# Patient Record
Sex: Female | Born: 1990 | Race: White | Hispanic: No | Marital: Single | Smoking: Former smoker
Health system: Southern US, Community
[De-identification: ages and names within clinical notes are randomized; demographics above are authoritative.]

## PROBLEM LIST (undated history)

## (undated) DIAGNOSIS — D649 Anemia, unspecified: Secondary | ICD-10-CM

## (undated) DIAGNOSIS — Z8614 Personal history of Methicillin resistant Staphylococcus aureus infection: Secondary | ICD-10-CM

## (undated) HISTORY — PX: NO PAST SURGERIES: SHX2092

---
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
## 2013-12-01 IMAGING — CR DG HAND COMPLETE 3+V*L*
3 series · 3 of 3 positions shown · non-contrast
Comparison: Left hand x-rays 10/03/2003.

CLINICAL DATA: Injury to left hand yesterday, struck on a metal
sink.  Pain and soft tissue swelling involving the long finger.

LEFT HAND - COMPLETE 3+ VIEW

[view not recorded (1 of 3)]
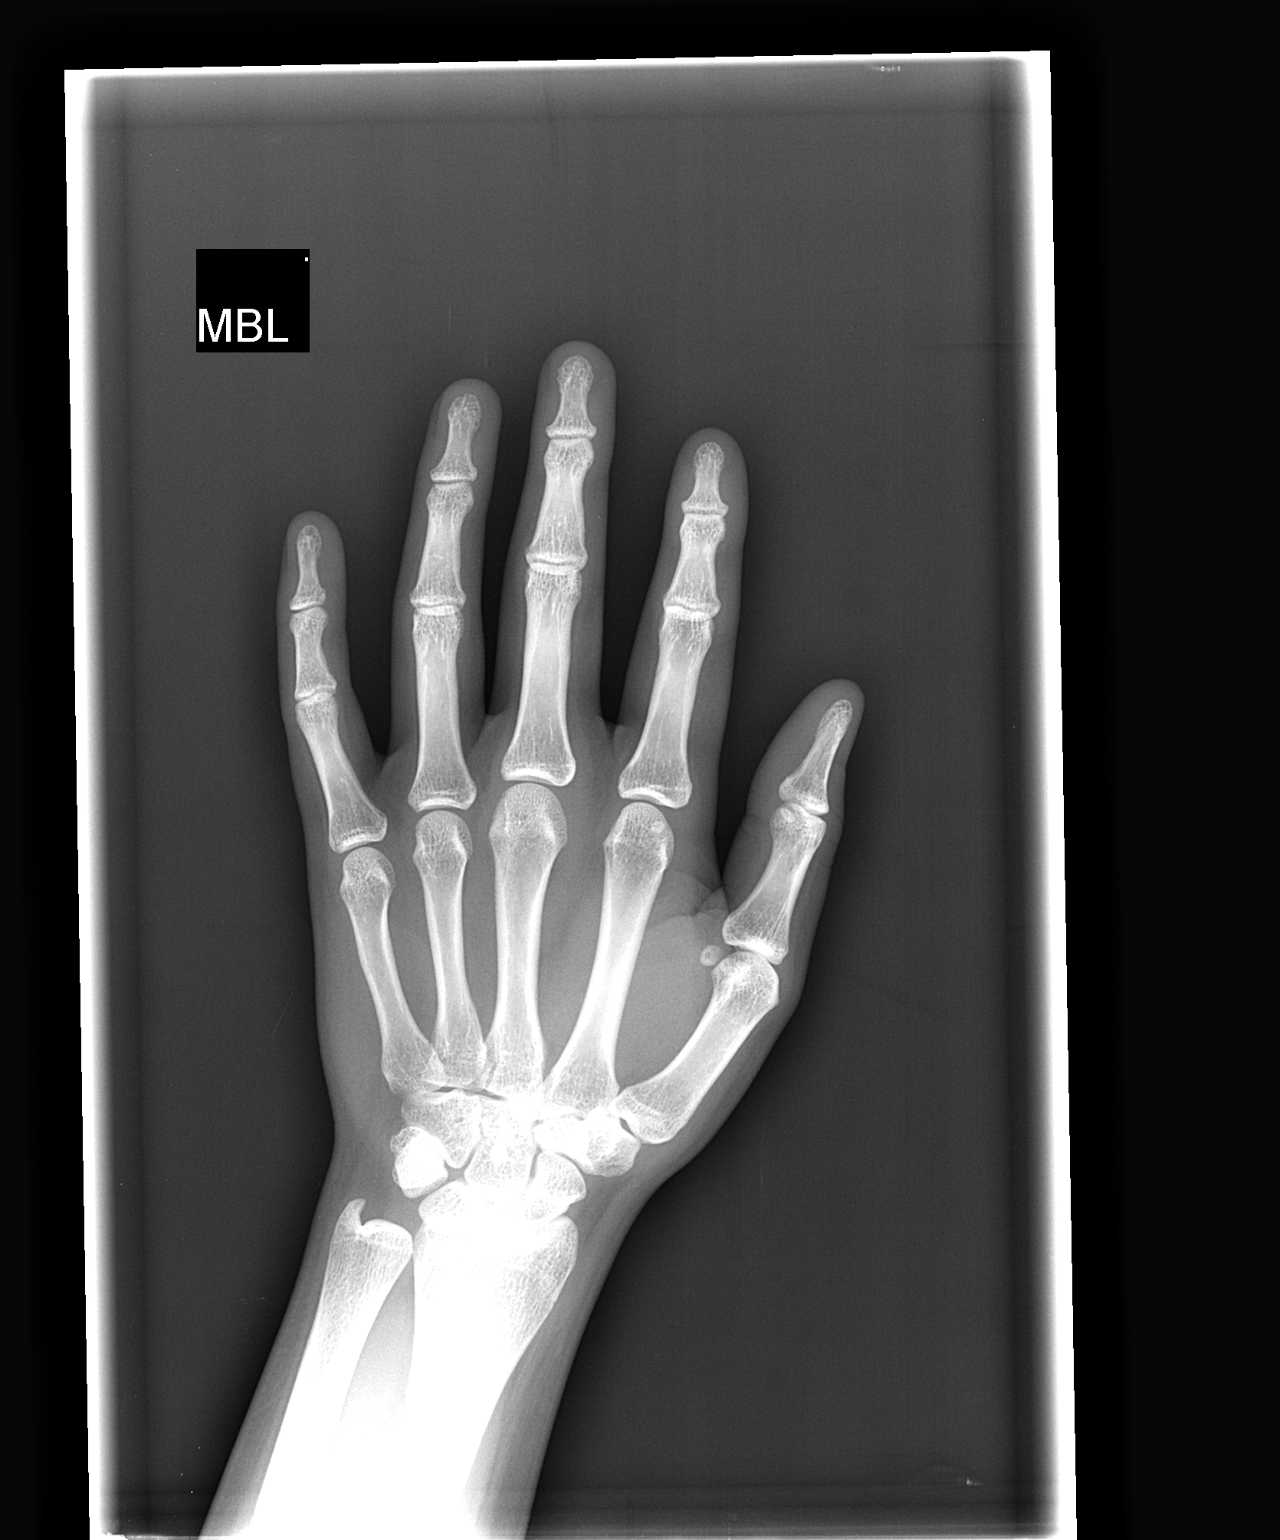

[view not recorded (2 of 3)]
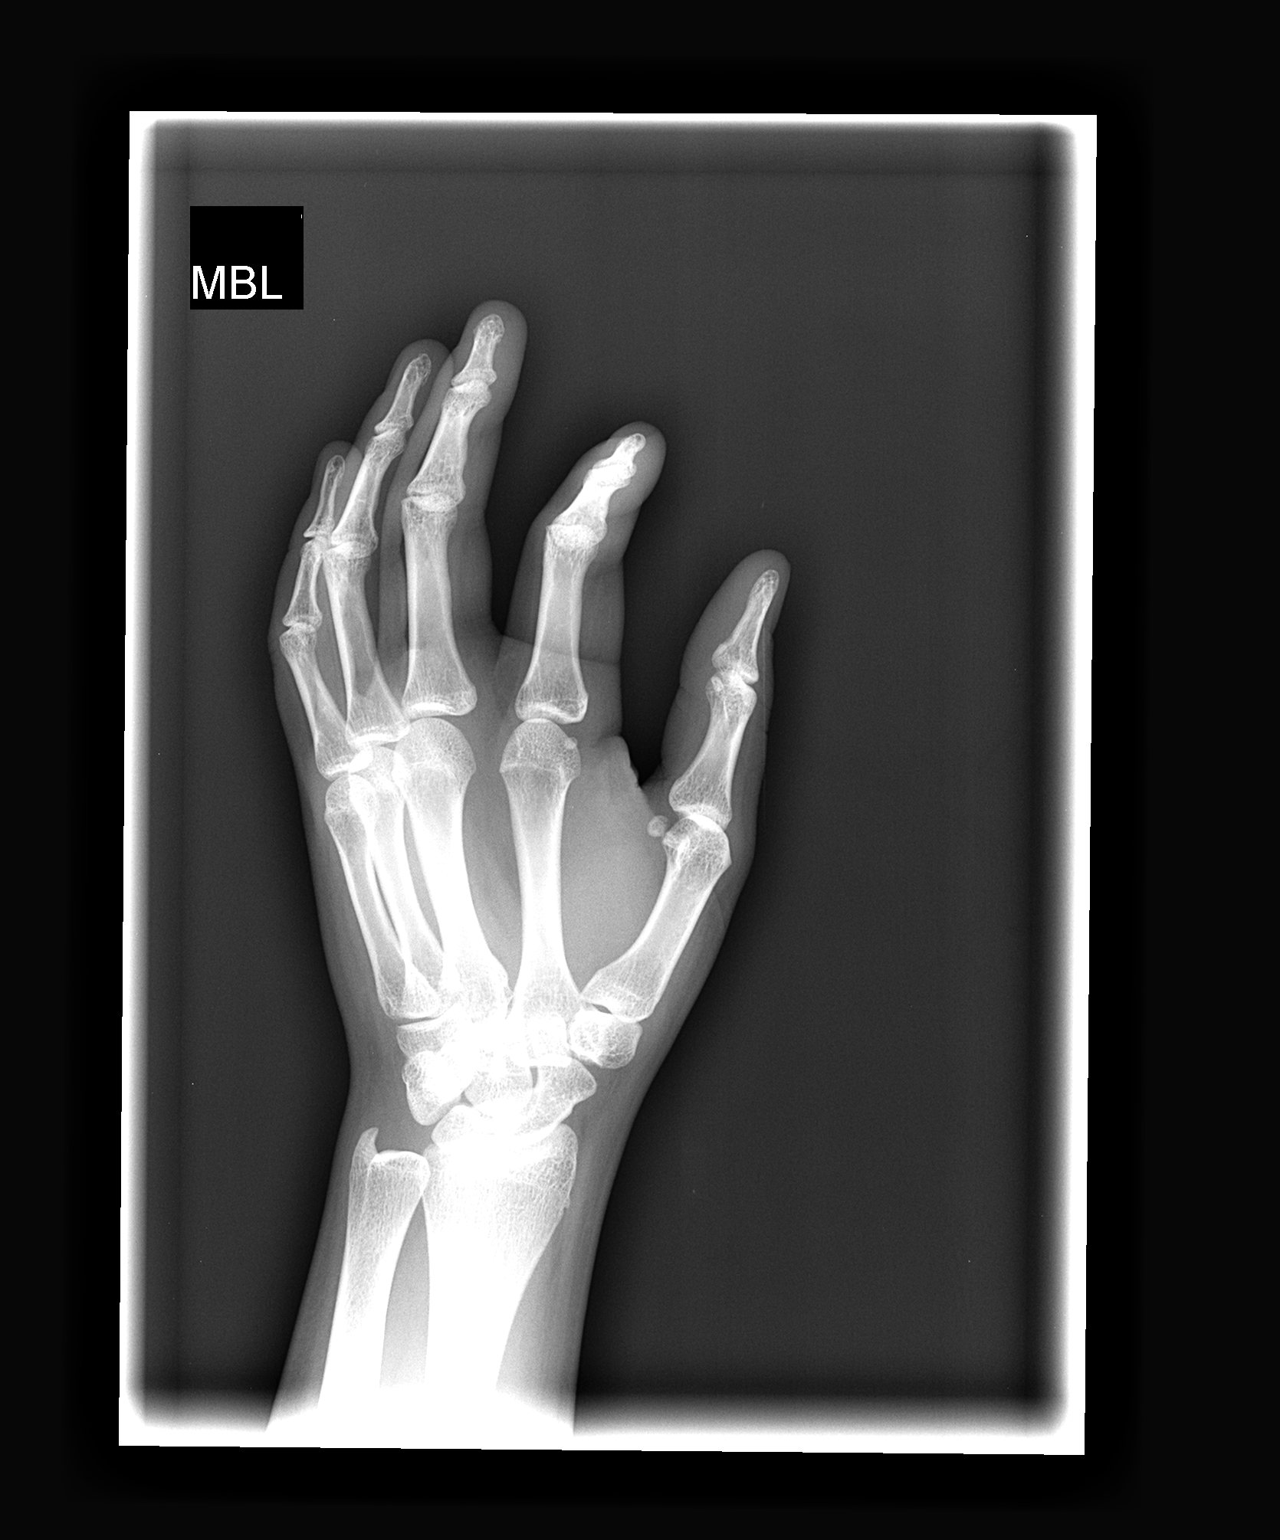

[view not recorded (3 of 3)]
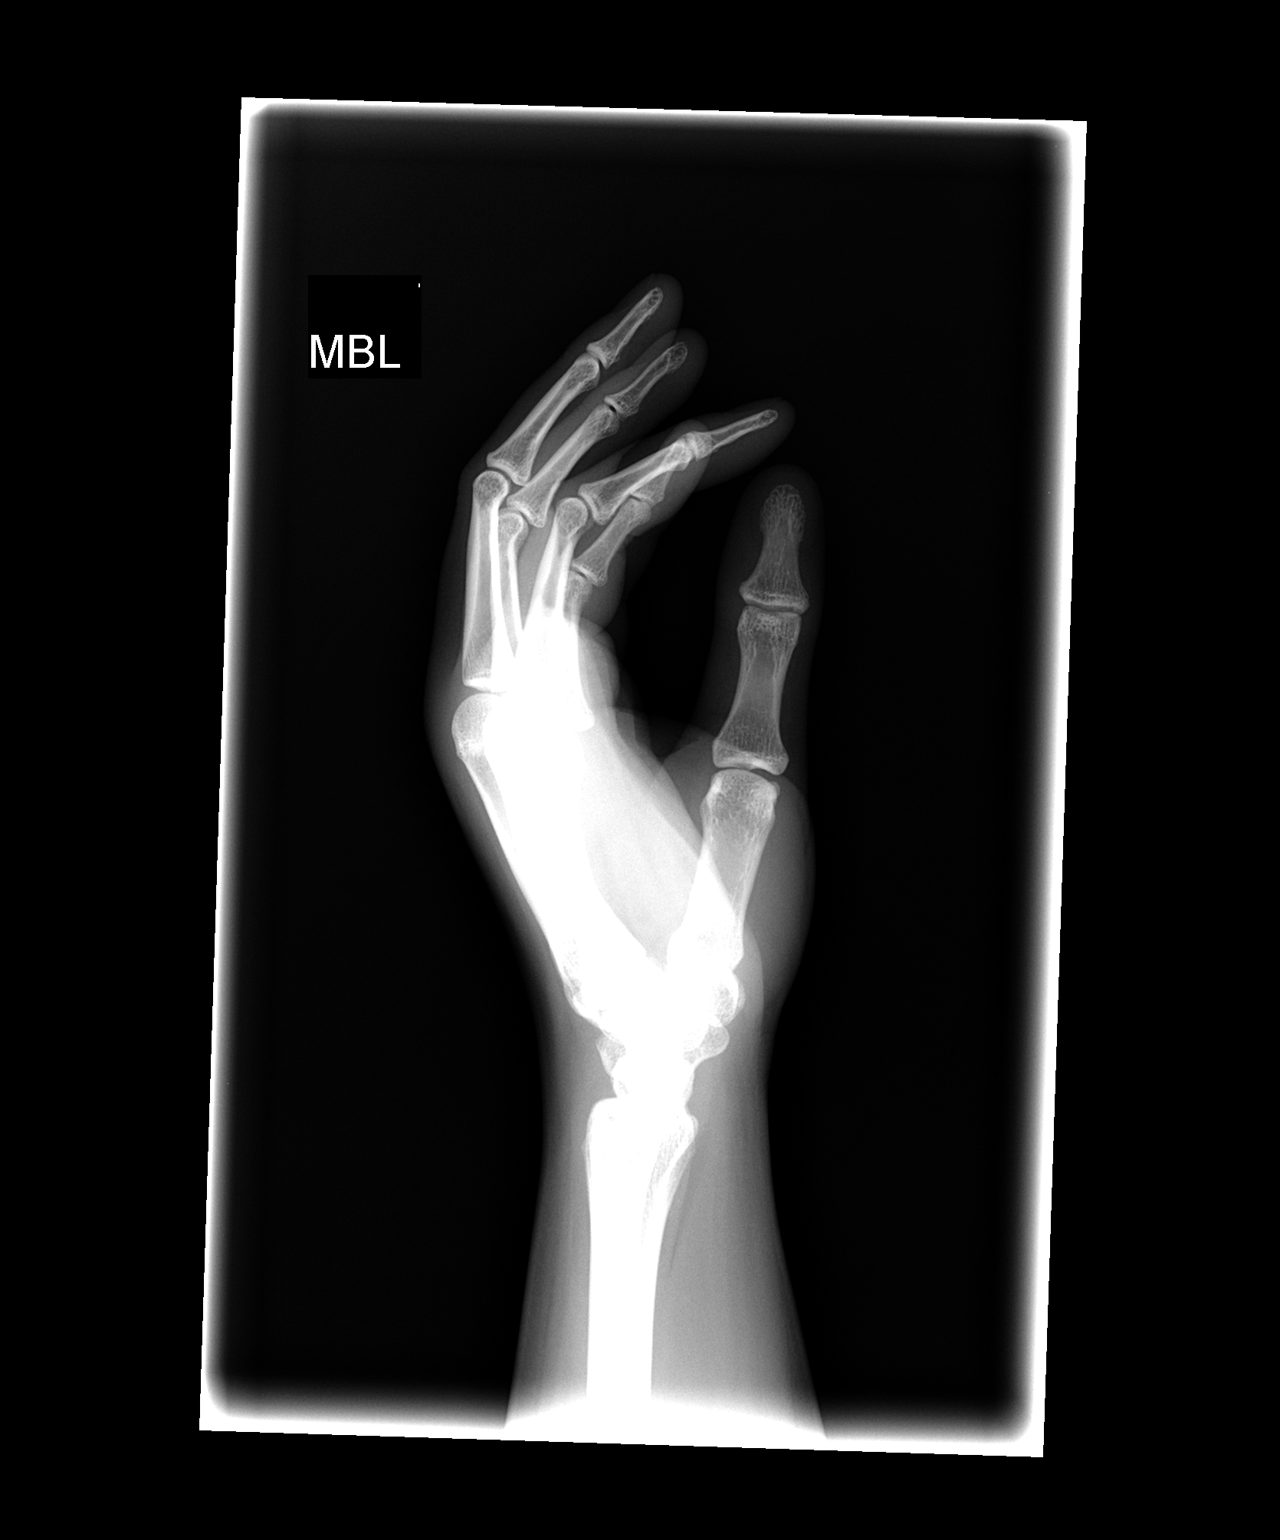

[3 of 3 positions shown; findings below may reference images not displayed]

FINDINGS: No evidence of acute or subacute fracture or dislocation.
Well-preserved joint spaces.  Well-preserved bone mineral density.
No intrinsic osseous abnormalities.
IMPRESSION: Normal examination.

## 2014-01-09 ENCOUNTER — Emergency Department (HOSPITAL_COMMUNITY)
Admission: EM | Admit: 2014-01-09 | Discharge: 2014-01-10 | Disposition: A | Discharge status: Home / Self Care | Payer: BC Managed Care – PPO | Attending: Emergency Medicine | Admitting: Emergency Medicine

## 2014-01-09 ENCOUNTER — Encounter (HOSPITAL_COMMUNITY): Payer: Self-pay | Admitting: Emergency Medicine

## 2014-01-09 DIAGNOSIS — Z3202 Encounter for pregnancy test, result negative: Secondary | ICD-10-CM | POA: Insufficient documentation

## 2014-01-09 DIAGNOSIS — Z79899 Other long term (current) drug therapy: Secondary | ICD-10-CM | POA: Insufficient documentation

## 2014-01-09 DIAGNOSIS — R109 Unspecified abdominal pain: Secondary | ICD-10-CM

## 2014-01-09 DIAGNOSIS — Z880 Allergy status to penicillin: Secondary | ICD-10-CM | POA: Insufficient documentation

## 2014-01-09 DIAGNOSIS — Z8614 Personal history of Methicillin resistant Staphylococcus aureus infection: Secondary | ICD-10-CM | POA: Insufficient documentation

## 2014-01-09 DIAGNOSIS — R112 Nausea with vomiting, unspecified: Secondary | ICD-10-CM | POA: Insufficient documentation

## 2014-01-09 DIAGNOSIS — Z792 Long term (current) use of antibiotics: Secondary | ICD-10-CM | POA: Insufficient documentation

## 2014-01-09 DIAGNOSIS — R209 Unspecified disturbances of skin sensation: Secondary | ICD-10-CM | POA: Insufficient documentation

## 2014-01-09 DIAGNOSIS — B9689 Other specified bacterial agents as the cause of diseases classified elsewhere: Secondary | ICD-10-CM | POA: Insufficient documentation

## 2014-01-09 DIAGNOSIS — N760 Acute vaginitis: Secondary | ICD-10-CM | POA: Insufficient documentation

## 2014-01-09 DIAGNOSIS — A499 Bacterial infection, unspecified: Secondary | ICD-10-CM | POA: Insufficient documentation

## 2014-01-09 LAB — CBC WITH DIFFERENTIAL/PLATELET
Basophils Absolute: 0.1 10*3/uL (ref 0.0–0.1)
Basophils Relative: 1 % (ref 0–1)
EOS PCT: 2 % (ref 0–5)
Eosinophils Absolute: 0.2 10*3/uL (ref 0.0–0.7)
HCT: 36.3 % (ref 36.0–46.0)
Hemoglobin: 12 g/dL (ref 12.0–15.0)
Lymphocytes Relative: 35 % (ref 12–46)
Lymphs Abs: 2.6 10*3/uL (ref 0.7–4.0)
MCH: 29.4 pg (ref 26.0–34.0)
MCHC: 33.1 g/dL (ref 30.0–36.0)
MCV: 89 fL (ref 78.0–100.0)
MONOS PCT: 7 % (ref 3–12)
Monocytes Absolute: 0.5 10*3/uL (ref 0.1–1.0)
NEUTROS ABS: 4.2 10*3/uL (ref 1.7–7.7)
NEUTROS PCT: 55 % (ref 43–77)
PLATELETS: 203 10*3/uL (ref 150–400)
RBC: 4.08 MIL/uL (ref 3.87–5.11)
RDW: 12.9 % (ref 11.5–15.5)
WBC: 7.5 10*3/uL (ref 4.0–10.5)

## 2014-01-09 LAB — COMPREHENSIVE METABOLIC PANEL
ALK PHOS: 57 U/L (ref 39–117)
ALT: 12 U/L (ref 0–35)
ANION GAP: 12 (ref 5–15)
AST: 17 U/L (ref 0–37)
Albumin: 4.2 g/dL (ref 3.5–5.2)
BUN: 12 mg/dL (ref 6–23)
CALCIUM: 9.4 mg/dL (ref 8.4–10.5)
CO2: 24 meq/L (ref 19–32)
CREATININE: 0.81 mg/dL (ref 0.50–1.10)
Chloride: 107 mEq/L (ref 96–112)
GFR calc non Af Amer: >90 mL/min (ref >90)
Glucose, Bld: 97 mg/dL (ref 70–99)
POTASSIUM: 4.1 meq/L (ref 3.7–5.3)
SODIUM: 143 meq/L (ref 137–147)
TOTAL PROTEIN: 7.5 g/dL (ref 6.0–8.3)
Total Bilirubin: 0.3 mg/dL (ref 0.3–1.2)

## 2014-01-09 LAB — URINALYSIS, ROUTINE W REFLEX MICROSCOPIC
BILIRUBIN URINE: NEGATIVE
Glucose, UA: NEGATIVE mg/dL
HGB URINE DIPSTICK: NEGATIVE
Ketones, ur: NEGATIVE mg/dL
NITRITE: NEGATIVE
Protein, ur: NEGATIVE mg/dL
SPECIFIC GRAVITY, URINE: 1.023 (ref 1.005–1.030)
UROBILINOGEN UA: 0.2 mg/dL (ref 0.0–1.0)
pH: 6.5 (ref 5.0–8.0)

## 2014-01-09 LAB — PREGNANCY, URINE: PREG TEST UR: NEGATIVE

## 2014-01-09 LAB — URINE MICROSCOPIC-ADD ON

## 2014-01-09 LAB — LIPASE, BLOOD: Lipase: 37 U/L (ref 11–59)

## 2014-01-09 NOTE — ED Notes (Signed)
Patient presents stating that she has been having abd pain, N/V, legs tingly and arms tingly

## 2014-01-10 LAB — WET PREP, GENITAL
Trich, Wet Prep: NONE SEEN
Yeast Wet Prep HPF POC: NONE SEEN

## 2014-01-10 LAB — RPR

## 2014-01-10 LAB — HIV ANTIBODY (ROUTINE TESTING W REFLEX): HIV: NONREACTIVE

## 2014-01-10 MED ORDER — METRONIDAZOLE 500 MG PO TABS: 500.0000 mg | ORAL_TABLET | Freq: Two times a day (BID) | ORAL | Status: DC

## 2014-01-10 NOTE — ED Provider Notes (Signed)
CSN: [id number]     Arrival date & time 01/09/14  2030 History   First MD Initiated Contact with Patient 01/09/14 2342     Chief Complaint  Patient presents with  . Nausea  . Abdominal Pain  . Leg numbness      (Consider location/radiation/quality/duration/timing/severity/associated sxs/prior Treatment) HPI  This is a 23 year old with no significant past medical history who presents with abdominal pain and nausea vomiting x 1 week. She reports lower crampy abdominal pain that feels like "hunger pains." She reports nonbilious, nonbloody emesis.  Mostly today. She has been able to keep some food down. Nothing makes her pain or vomiting better or worse. Currently her pain a 6/10. She's not taken anything for pain. Last menstrual period was "mid June." She is sexually active. She denies concerns for STDs and states that she is a monogamous relationship. She's not currently on birth control and does not use condoms.  She denies any diarrhea.  No fevers or known sick contacts.  Past Medical History  Diagnosis Date  . History of MRSA infection    History reviewed. No pertinent past surgical history. History reviewed. No pertinent family history. History  Substance Use Topics  . Smoking status: Never Smoker   . Smokeless tobacco: Not on file  . Alcohol Use: No   OB History   Grav Para Term Preterm Abortions TAB SAB Ect Mult Living                 Review of Systems  Constitutional: Negative for fever.  Respiratory: Negative for chest tightness and shortness of breath.   Cardiovascular: Negative for chest pain.  Gastrointestinal: Positive for nausea, vomiting and abdominal pain. Negative for diarrhea and constipation.  Genitourinary: Negative for dysuria and hematuria.  Musculoskeletal: Negative for back pain.  Neurological: Negative for headaches.  All other systems reviewed and are negative.     Allergies  Sulfa antibiotics and Penicillins  Home Medications   Prior to  Admission medications   Medication Sig Start Date End Date Taking? Authorizing Provider  ferrous sulfate 325 (65 FE) MG tablet Take 325 mg by mouth daily with breakfast.    Historical Provider, MD  ibuprofen (ADVIL,MOTRIN) 400 MG tablet Take 2 tablets (800 mg total) by mouth every 8 (eight) hours as needed for pain. For pain relief 12/10/11   Cristal Ford, MD  metroNIDAZOLE (FLAGYL) 500 MG tablet Take 1 tablet (500 mg total) by mouth 2 (two) times daily. 01/10/14   Shon Baton, MD  pseudoephedrine (SUDAFED) 30 MG tablet Take 30 mg by mouth every 4 (four) hours as needed.    Historical Provider, MD   BP 98/61  Pulse 63  Temp(Src) 98.7 F (37.1 C) (Oral)  Resp 16  SpO2 99%  LMP 12/06/2013 Physical Exam  Nursing note and vitals reviewed. Constitutional: She is oriented to person, place, and time. She appears well-developed and well-nourished. No distress.  HENT:  Head: Normocephalic and atraumatic.  Eyes: Pupils are equal, round, and reactive to light.  Cardiovascular: Normal rate, regular rhythm and normal heart sounds.   No murmur heard. Pulmonary/Chest: Effort normal and breath sounds normal. No respiratory distress. She has no wheezes.  Abdominal: Soft. Bowel sounds are normal. There is no tenderness. There is no rebound and no guarding.  Genitourinary: Vagina normal and uterus normal.  No discharge or CMT  Neurological: She is alert and oriented to person, place, and time.  Skin: Skin is warm and dry.  Psychiatric: She has  a normal mood and affect.    ED Course  Procedures (including critical care time) Labs Review Labs Reviewed  WET PREP, GENITAL - Abnormal; Notable for the following:    Clue Cells Wet Prep HPF POC FEW (*)    WBC, Wet Prep HPF POC FEW (*)    All other components within normal limits  URINALYSIS, ROUTINE W REFLEX MICROSCOPIC - Abnormal; Notable for the following:    APPearance CLOUDY (*)    Leukocytes, UA SMALL (*)    All other components within  normal limits  URINE MICROSCOPIC-ADD ON - Abnormal; Notable for the following:    Squamous Epithelial / LPF MANY (*)    Bacteria, UA FEW (*)    All other components within normal limits  URINE CULTURE  GC/CHLAMYDIA PROBE AMP  CBC WITH DIFFERENTIAL  COMPREHENSIVE METABOLIC PANEL  LIPASE, BLOOD  PREGNANCY, URINE  RPR  HIV ANTIBODY (ROUTINE TESTING)    Imaging Review No results found.   EKG Interpretation None      MDM   Final diagnoses:  Bacterial vaginosis  Abdominal pain, unspecified abdominal location   Patient presents with abdominal pain and vomiting. She is nontoxic on exam and nontender. Basic labwork including urine pregnancy is reassuring. Patient thought she may be pregnant. Pelvic exam is benign and patient does not express concerns for STDs. Has deferred treatment at this time but will send STD screens. Patient was given referral to the women's outpatient clinic for any continued symptoms. She was able tolerate fluids.  After history, exam, and medical workup I feel the patient has been appropriately medically screened and is safe for discharge home. Pertinent diagnoses were discussed with the patient. Patient was given return precautions.   Shon Batonourtney F Marquon Alcala, MD 01/10/14 [phone]

## 2014-01-10 NOTE — Discharge Instructions (Signed)
Abdominal Pain, Women °Abdominal (stomach, pelvic, or belly) pain can be caused by many things. It is important to tell your doctor: °· The location of the pain. °· Does it come and go or is it present all the time? °· Are there things that start the pain (eating certain foods, exercise)? °· Are there other symptoms associated with the pain (fever, nausea, vomiting, diarrhea)? °All of this is helpful to know when trying to find the cause of the pain. °CAUSES  °· Stomach: virus or bacteria infection, or ulcer. °· Intestine: appendicitis (inflamed appendix), regional ileitis (Crohn's disease), ulcerative colitis (inflamed colon), irritable bowel syndrome, diverticulitis (inflamed diverticulum of the colon), or cancer of the stomach or intestine. °· Gallbladder disease or stones in the gallbladder. °· Kidney disease, kidney stones, or infection. °· Pancreas infection or cancer. °· Fibromyalgia (pain disorder). °· Diseases of the female organs: °· Uterus: fibroid (non-cancerous) tumors or infection. °· Fallopian tubes: infection or tubal pregnancy. °· Ovary: cysts or tumors. °· Pelvic adhesions (scar tissue). °· Endometriosis (uterus lining tissue growing in the pelvis and on the pelvic organs). °· Pelvic congestion syndrome (female organs filling up with blood just before the menstrual period). °· Pain with the menstrual period. °· Pain with ovulation (producing an egg). °· Pain with an IUD (intrauterine device, birth control) in the uterus. °· Cancer of the female organs. °· Functional pain (pain not caused by a disease, may improve without treatment). °· Psychological pain. °· Depression. °DIAGNOSIS  °Your doctor will decide the seriousness of your pain by doing an examination. °· Blood tests. °· X-rays. °· Ultrasound. °· CT scan (computed tomography, special type of X-ray). °· MRI (magnetic resonance imaging). °· Cultures, for infection. °· Barium enema (dye inserted in the large intestine, to better view it with  X-rays). °· Colonoscopy (looking in intestine with a lighted tube). °· Laparoscopy (minor surgery, looking in abdomen with a lighted tube). °· Major abdominal exploratory surgery (looking in abdomen with a large incision). °TREATMENT  °The treatment will depend on the cause of the pain.  °· Many cases can be observed and treated at home. °· Over-the-counter medicines recommended by your caregiver. °· Prescription medicine. °· Antibiotics, for infection. °· Birth control pills, for painful periods or for ovulation pain. °· Hormone treatment, for endometriosis. °· Nerve blocking injections. °· Physical therapy. °· Antidepressants. °· Counseling with a psychologist or psychiatrist. °· Minor or major surgery. °HOME CARE INSTRUCTIONS  °· Do not take laxatives, unless directed by your caregiver. °· Take over-the-counter pain medicine only if ordered by your caregiver. Do not take aspirin because it can cause an upset stomach or bleeding. °· Try a clear liquid diet (broth or water) as ordered by your caregiver. Slowly move to a bland diet, as tolerated, if the pain is related to the stomach or intestine. °· Have a thermometer and take your temperature several times a day, and record it. °· Bed rest and sleep, if it helps the pain. °· Avoid sexual intercourse, if it causes pain. °· Avoid stressful situations. °· Keep your follow-up appointments and tests, as your caregiver orders. °· If the pain does not go away with medicine or surgery, you may try: °· Acupuncture. °· Relaxation exercises (yoga, meditation). °· Group therapy. °· Counseling. °SEEK MEDICAL CARE IF:  °· You notice certain foods cause stomach pain. °· Your home care treatment is not helping your pain. °· You need stronger pain medicine. °· You want your IUD removed. °· You feel faint or   lightheaded. °· You develop nausea and vomiting. °· You develop a rash. °· You are having side effects or an allergy to your medicine. °SEEK IMMEDIATE MEDICAL CARE IF:  °· Your  pain does not go away or gets worse. °· You have a fever. °· Your pain is felt only in portions of the abdomen. The right side could possibly be appendicitis. The left lower portion of the abdomen could be colitis or diverticulitis. °· You are passing blood in your stools (bright red or black tarry stools, with or without vomiting). °· You have blood in your urine. °· You develop chills, with or without a fever. °· You pass out. °MAKE SURE YOU:  °· Understand these instructions. °· Will watch your condition. °· Will get help right away if you are not doing well or get worse. °Document Released: 04/02/2007 Document Revised: 10/20/2013 Document Reviewed: 04/22/2009 °ExitCare® Patient Information ©2015 [employer]. This information is not intended to replace advice given to you by your health care provider. Make sure you discuss any questions you have with your health care provider. °Bacterial Vaginosis °Bacterial vaginosis is a vaginal infection that occurs when the normal balance of bacteria in the vagina is disrupted. It results from an overgrowth of certain bacteria. This is the most common vaginal infection in women of childbearing age. Treatment is important to prevent complications, especially in pregnant women, as it can cause a premature delivery. °CAUSES  °Bacterial vaginosis is caused by an increase in harmful bacteria that are normally present in smaller amounts in the vagina. Several different kinds of bacteria can cause bacterial vaginosis. However, the reason that the condition develops is not fully understood. °RISK FACTORS °Certain activities or behaviors can put you at an increased risk of developing bacterial vaginosis, including: °· Having a new sex partner or multiple sex partners. °· Douching. °· Using an intrauterine device (IUD) for contraception. °Women do not get bacterial vaginosis from toilet seats, bedding, swimming pools, or contact with objects around them. °SIGNS AND SYMPTOMS  °Some  women with bacterial vaginosis have no signs or symptoms. Common symptoms include: °· Grey vaginal discharge. °· A fishlike odor with discharge, especially after sexual intercourse. °· Itching or burning of the vagina and vulva. °· Burning or pain with urination. °DIAGNOSIS  °Your health care provider will take a medical history and examine the vagina for signs of bacterial vaginosis. A sample of vaginal fluid may be taken. Your health care provider will look at this sample under a microscope to check for bacteria and abnormal cells. A vaginal pH test may also be done.  °TREATMENT  °Bacterial vaginosis may be treated with antibiotic medicines. These may be given in the form of a pill or a vaginal cream. A second round of antibiotics may be prescribed if the condition comes back after treatment.  °HOME CARE INSTRUCTIONS  °· Only take over-the-counter or prescription medicines as directed by your health care provider. °· If antibiotic medicine was prescribed, take it as directed. Make sure you finish it even if you start to feel better. °· Do not have sex until treatment is completed. °· Tell all sexual partners that you have a vaginal infection. They should see their health care provider and be treated if they have problems, such as a mild rash or itching. °· Practice safe sex by using condoms and only having one sex partner. °SEEK MEDICAL CARE IF:  °· Your symptoms are not improving after 3 days of treatment. °· You have increased discharge   or pain. °· You have a fever. °MAKE SURE YOU:  °· Understand these instructions. °· Will watch your condition. °· Will get help right away if you are not doing well or get worse. °FOR MORE INFORMATION  °Centers for Disease Control and Prevention, Division of STD Prevention: www.cdc.gov/std °American Sexual Health Association (ASHA): www.ashastd.org  °Document Released: 06/05/2005 Document Revised: 03/26/2013 Document Reviewed: 01/15/2013 °ExitCare® Patient Information ©2015  [employer]. This information is not intended to replace advice given to you by your health care provider. Make sure you discuss any questions you have with your health care provider. ° °

## 2014-01-13 LAB — URINE CULTURE: Colony Count: 50000

## 2014-01-13 LAB — GC/CHLAMYDIA PROBE AMP
CT PROBE, AMP APTIMA: POSITIVE — AB
GC PROBE AMP APTIMA: NEGATIVE

## 2014-01-14 ENCOUNTER — Telehealth (HOSPITAL_BASED_OUTPATIENT_CLINIC_OR_DEPARTMENT_OTHER): Payer: Self-pay

## 2014-01-14 NOTE — Telephone Encounter (Signed)
Post ED Visit - Positive Culture Follow-up: Successful Patient Follow-Up  Culture assessed and recommendations reviewed by: []  Wes Dulaney, Pharm.D., BCPS [x]  Celedonio MiyamotoJeremy Frens, [address] BCPS []  Georgina PillionElizabeth Martin, [address]Ph[address] BCPS []  RentchlerMinh Pham, [address]Ph[address] BCPS, AAHIVP []  Estella HuskMichelle Turner, Pharm.D., BCPS, AAHIVP  Positive STD culture  []  Patient discharged without antimicrobial prescription and treatment is now indicated []  Organism is resistant to prescribed ED discharge antimicrobial [x]  Patient with positive Chlamydia  Changes discussed with ED provider: Felicita GageJ. Geiple PA New antibiotic prescription "Azithromycin 1 gram x 1 dose Called to [facility] - [phone] Contacted patient, date 01/14/2014, time 17:46 Pt informed of dx, need for addl tx, notify partner(s) for tesating and tx and abstain from sex x 2 wks from tx date.  DHHS form completed and faxed.   Arvid RightClark, Agueda Houpt Dorn 01/14/2014, 5:51 PM

## 2014-01-14 NOTE — Progress Notes (Signed)
ED Antimicrobial Stewardship Positive Culture Follow Up   KEYMANI GLYNN is an 23 y.o. female who presented to [facility] on 01/09/2014 with a chief complaint of  Chief Complaint  Patient presents with  . Nausea  . Abdominal Pain  . Leg numbness     Recent Results (from the past 720 hour(s))  URINE CULTURE     Status: None   Collection Time    01/09/14  9:57 PM      Result Value Ref Range Status   Specimen Description URINE, CLEAN CATCH   Final   Special Requests ADDED ON 01/09/14 AT 2344   Final   Culture  Setup Time     Final   Value: 01/10/2014 15:50     Performed at [facility] Count     Final   Value: 50,000 COLONIES/ML     Performed at [facility]   Culture     Final   Value: STAPHYLOCOCCUS SPECIES (COAGULASE NEGATIVE)     Note: RIFAMPIN AND GENTAMICIN SHOULD NOT BE USED AS SINGLE DRUGS FOR TREATMENT OF STAPH INFECTIONS.     Performed at [facility]   Report Status 01/13/2014 FINAL   Final   Organism ID, Bacteria STAPHYLOCOCCUS SPECIES (COAGULASE NEGATIVE)   Final  GC/CHLAMYDIA PROBE AMP     Status: Abnormal   Collection Time    01/10/14 12:24 AM      Result Value Ref Range Status   CT Probe RNA POSITIVE (*) NEGATIVE Final   Comment: (NOTE)     A Positive CT or NG Nucleic Acid Amplification Test (NAAT) result     should be considered presumptive evidence of infection.  The result     should be evaluated along with physical examination and other     diagnostic findings.   GC Probe RNA NEGATIVE  NEGATIVE Final   Comment: (NOTE)                                                                                               **Normal Reference Range: Negative**          Assay performed using the Gen-Probe APTIMA COMBO2 (R) Assay.     Acceptable specimen types for this assay include APTIMA Swabs (Unisex,     endocervical, urethral, or vaginal), first void urine, and ThinPrep     liquid based cytology samples.     Performed at [facility]  WET PREP, GENITAL     Status: Abnormal   Collection Time    01/10/14 12:24 AM      Result Value Ref Range Status   Yeast Wet Prep HPF POC NONE SEEN  NONE SEEN Final   Trich, Wet Prep NONE SEEN  NONE SEEN Final   Clue Cells Wet Prep HPF POC FEW (*) NONE SEEN Final   WBC, Wet Prep HPF POC FEW (*) NONE SEEN Final     []  Patient discharged originally without antimicrobial agent and treatment is now indicated  New antibiotic prescription: azithromycin 1g po x 1 dose  ED Provider: Renne Crigler, PA-C  Mickeal SkinnerFrens, Christyann Manolis John 01/14/2014, 10:10 AM Infectious Diseases Pharmacist Phone# [phone]

## 2015-06-20 NOTE — L&D Delivery Note (Signed)
Delivery Note At 4:07 AM a viable female, "Gloria Henderson", was delivered via Vaginal, Spontaneous Delivery (Presentation: ROA).  APGAR: 9, 9; weight  .   Placenta status: Spontaneous, intact.  Cord:  WNL, with the following complications: None.  Cord pH: NA  Anesthesia:  Local for repair Episiotomy: None Lacerations: 2nd degree;Perineal; right and left periurethral Suture Repair: 3.0 vicryl Est. Blood Loss (mL):  150 cc  Red Robinson catheter placed for urethral localization during repair.  Mom to postpartum.  Baby to Couplet care / Skin to Skin. Family plans outpatient circumcision at outside office (not [facility]) Patient undecided regarding contraception.  Seraj Dunnam 02/26/2016, 5:00 AM

## 2015-08-16 LAB — OB RESULTS CONSOLE GC/CHLAMYDIA
Chlamydia: NEGATIVE
GC PROBE AMP, GENITAL: NEGATIVE

## 2015-08-16 LAB — OB RESULTS CONSOLE ANTIBODY SCREEN: Antibody Screen: NEGATIVE

## 2015-08-16 LAB — OB RESULTS CONSOLE ABO/RH: RH Type: POSITIVE

## 2015-08-16 LAB — OB RESULTS CONSOLE RPR: RPR: NONREACTIVE

## 2015-08-16 LAB — OB RESULTS CONSOLE HIV ANTIBODY (ROUTINE TESTING): HIV: NONREACTIVE

## 2015-08-16 LAB — OB RESULTS CONSOLE RUBELLA ANTIBODY, IGM: Rubella: IMMUNE

## 2015-08-16 LAB — OB RESULTS CONSOLE HEPATITIS B SURFACE ANTIGEN: Hepatitis B Surface Ag: NEGATIVE

## 2015-08-24 ENCOUNTER — Inpatient Hospital Stay (HOSPITAL_COMMUNITY)
Admission: AD | Admit: 2015-08-24 | Payer: BC Managed Care – PPO | Source: Ambulatory Visit | Admitting: Obstetrics and Gynecology

## 2016-01-28 LAB — OB RESULTS CONSOLE GBS: STREP GROUP B AG: NEGATIVE

## 2016-02-23 ENCOUNTER — Encounter (HOSPITAL_COMMUNITY): Payer: Self-pay | Admitting: *Deleted

## 2016-02-23 ENCOUNTER — Inpatient Hospital Stay (HOSPITAL_COMMUNITY)
Admission: AD | Admit: 2016-02-23 | Discharge: 2016-02-23 | Disposition: A | Discharge status: Home / Self Care | Payer: BLUE CROSS/BLUE SHIELD | Source: Ambulatory Visit | Attending: Obstetrics and Gynecology | Admitting: Obstetrics and Gynecology

## 2016-02-23 HISTORY — DX: Anemia, unspecified: D64.9

## 2016-02-23 LAB — AMNISURE RUPTURE OF MEMBRANE (ROM) NOT AT ARMC: Amnisure ROM: NEGATIVE

## 2016-02-23 LAB — POCT FERN TEST: POCT Fern Test: POSITIVE

## 2016-02-23 NOTE — MAU Note (Signed)
Urine sent to lab 

## 2016-02-23 NOTE — MAU Note (Signed)
Having pelvic pain, comes and goes. Pain in lower back is more constant. Was at dr's office.  Was 1 cm, membranes stripped.  Doesn't feel like her questions were answered. Doesn't know what is going on.

## 2016-02-23 NOTE — Discharge Instructions (Signed)

## 2016-02-25 ENCOUNTER — Encounter (HOSPITAL_COMMUNITY): Payer: Self-pay | Admitting: *Deleted

## 2016-02-25 ENCOUNTER — Inpatient Hospital Stay (HOSPITAL_COMMUNITY)
Admission: AD | Admit: 2016-02-25 | Discharge: 2016-02-27 | DRG: 775 | Disposition: A | Discharge status: Home / Self Care | Payer: BLUE CROSS/BLUE SHIELD | Source: Ambulatory Visit | Attending: Obstetrics and Gynecology | Admitting: Obstetrics and Gynecology

## 2016-02-25 DIAGNOSIS — Z87891 Personal history of nicotine dependence: Secondary | ICD-10-CM

## 2016-02-25 DIAGNOSIS — Z880 Allergy status to penicillin: Secondary | ICD-10-CM

## 2016-02-25 DIAGNOSIS — Z8614 Personal history of Methicillin resistant Staphylococcus aureus infection: Secondary | ICD-10-CM | POA: Diagnosis not present

## 2016-02-25 DIAGNOSIS — Z3A39 39 weeks gestation of pregnancy: Secondary | ICD-10-CM

## 2016-02-25 DIAGNOSIS — O4292 Full-term premature rupture of membranes, unspecified as to length of time between rupture and onset of labor: Secondary | ICD-10-CM | POA: Diagnosis present

## 2016-02-25 DIAGNOSIS — O4202 Full-term premature rupture of membranes, onset of labor within 24 hours of rupture: Principal | ICD-10-CM | POA: Diagnosis present

## 2016-02-25 DIAGNOSIS — Z882 Allergy status to sulfonamides status: Secondary | ICD-10-CM

## 2016-02-25 LAB — ABO/RH: ABO/RH(D): O POS

## 2016-02-25 LAB — CBC
HCT: 33.4 % — ABNORMAL LOW (ref 36.0–46.0)
Hemoglobin: 11.3 g/dL — ABNORMAL LOW (ref 12.0–15.0)
MCH: 29.9 pg (ref 26.0–34.0)
MCHC: 33.8 g/dL (ref 30.0–36.0)
MCV: 88.4 fL (ref 78.0–100.0)
PLATELETS: 152 10*3/uL (ref 150–400)
RBC: 3.78 MIL/uL — AB (ref 3.87–5.11)
RDW: 13.1 % (ref 11.5–15.5)
WBC: 8.7 10*3/uL (ref 4.0–10.5)

## 2016-02-25 LAB — TYPE AND SCREEN
ABO/RH(D): O POS
Antibody Screen: NEGATIVE

## 2016-02-25 MED ORDER — TERBUTALINE SULFATE 1 MG/ML IJ SOLN
0.2500 mg | Freq: Once | INTRAMUSCULAR | Status: DC | PRN
  Filled 2016-02-25: qty 1

## 2016-02-25 MED ORDER — OXYCODONE-ACETAMINOPHEN 5-325 MG PO TABS: 1.0000 | ORAL_TABLET | ORAL | Status: DC | PRN

## 2016-02-25 MED ORDER — LACTATED RINGERS IV SOLN: 500.0000 mL | INTRAVENOUS | Status: DC | PRN

## 2016-02-25 MED ORDER — FENTANYL CITRATE (PF) 100 MCG/2ML IJ SOLN
50.0000 ug | INTRAMUSCULAR | Status: DC | PRN
  Administered 2016-02-26 (×2): 100 ug via INTRAVENOUS
  Filled 2016-02-25 (×2): qty 2

## 2016-02-25 MED ORDER — OXYTOCIN 40 UNITS IN LACTATED RINGERS INFUSION - SIMPLE MED
1.0000 m[IU]/min | INTRAVENOUS | Status: DC
  Administered 2016-02-25: 2 m[IU]/min via INTRAVENOUS

## 2016-02-25 MED ORDER — OXYCODONE-ACETAMINOPHEN 5-325 MG PO TABS: 2.0000 | ORAL_TABLET | ORAL | Status: DC | PRN

## 2016-02-25 MED ORDER — SOD CITRATE-CITRIC ACID 500-334 MG/5ML PO SOLN: 30.0000 mL | ORAL | Status: DC | PRN

## 2016-02-25 MED ORDER — ONDANSETRON HCL 4 MG/2ML IJ SOLN: 4.0000 mg | Freq: Four times a day (QID) | INTRAMUSCULAR | Status: DC | PRN

## 2016-02-25 MED ORDER — LACTATED RINGERS IV SOLN
INTRAVENOUS | Status: DC
  Administered 2016-02-25: 22:00:00 via INTRAVENOUS

## 2016-02-25 MED ORDER — LIDOCAINE HCL (PF) 1 % IJ SOLN
30.0000 mL | INTRAMUSCULAR | Status: DC | PRN
  Administered 2016-02-26: 30 mL via SUBCUTANEOUS
  Filled 2016-02-25: qty 30

## 2016-02-25 MED ORDER — OXYTOCIN 40 UNITS IN LACTATED RINGERS INFUSION - SIMPLE MED
2.5000 [IU]/h | INTRAVENOUS | Status: DC
  Filled 2016-02-25: qty 1000

## 2016-02-25 MED ORDER — ACETAMINOPHEN 325 MG PO TABS: 650.0000 mg | ORAL_TABLET | ORAL | Status: DC | PRN

## 2016-02-25 MED ORDER — OXYTOCIN BOLUS FROM INFUSION
500.0000 mL | Freq: Once | INTRAVENOUS | Status: AC
  Administered 2016-02-26: 500 mL via INTRAVENOUS

## 2016-02-25 NOTE — Progress Notes (Signed)
  Subjective: Comfortable.  Objective: BP (!) 110/59   Pulse 82   Temp 97.9 F (36.6 C) (Oral)   Resp 18   Ht 5\' 4"  (1.626 m)   Wt 85.3 kg (188 lb)   BMI 32.27 kg/m  No intake/output data recorded. No intake/output data recorded.  FHT: Category 1 UC:   irregular, every 2-6 minutes SVE:   Dilation: 3 Effacement (%): 50 Station: -1 Exam by:: dr. Estanislado Pandyrivard at 1844 Pitocin at 8 mu/min  Assessment:  PROM at term x 11 hours On pitocin augmentation GBS negative  Plan: Continue current care. Evaluate prn.  Gloria Henderson, Gloria Henderson CNM 02/25/2016, 10:52 PM

## 2016-02-25 NOTE — H&P (Signed)
Gloria Henderson is a 25 y.o. female, G1P0 at 39+5 weeks with a confirmed EDD:02/27/16 presenting for SROM at noon today with clear/pinkish fluid. Patient reports good fetal movement and denies bleeding.  Pregnancy followed at [facility] since 12+1  weeks and remarkable for:  1. Migraines 2. History of cannabis use before pregnancy 3. Allergy to penicillin and sulfa  OB History    Gravida Para Term Preterm AB Living   1             SAB TAB Ectopic Multiple Live Births                 Past Medical History:  Diagnosis Date  . Anemia   . History of MRSA infection    Past Surgical History:  Procedure Laterality Date  . NO PAST SURGERIES      Family History:   Mother with cervical cancer  Social History:    reports that she has quit smoking. She has quit using smokeless tobacco. She reports that she does not drink alcohol or use drugs.   Prenatal labs: ABO, Rh:  O+ Antibody:  negative Rubella: immune RPR:   non-reactive HBsAg:   negative HIV:   negative GBS:   negative   Prenatal Transfer Tool  Maternal Diabetes: No Genetic Screening: Declined Maternal Ultrasounds/Referrals: Normal Fetal Ultrasounds or other Referrals:  None Maternal Substance Abuse:  No Significant Maternal Medications:  None Significant Maternal Lab Results: None     Blood pressure 108/75, pulse 114, temperature 98.3 F (36.8 C), temperature source Oral, resp. rate 16.  General Appearance: Alert, appropriate appearance for age. No acute distress HEENT Exam: Grossly normal Chest/Respiratory Exam: Normal chest wall and respirations. Clear to auscultation  Cardiovascular Exam: Regular rate and rhythm. S1, S2, no murmur Gastrointestinal Exam: soft, non-tender, Uterus gravid with size compatible with GA, Vertex presentation by Leopold's maneuvers Psychiatric Exam: Alert and oriented, appropriate affect  ++++++++++++++++++++++++++++++++++++++++++++++++++++++++++++++++  Vaginal exam: 3/50/-1  vertex  Fetal tracings: category 1  ++++++++++++++++++++++++++++++++++++++++++++++++++++++++++++++++   Assessment/Plan:  SIUP at 39+5 weeks SROM with GBS - Will start Pitocin Plan of care reviewed with patient   Silverio LaySandra Amarya Kuehl MD 02/25/2016, 4:55 PM

## 2016-02-25 NOTE — Progress Notes (Signed)
  Subjective: Unaware of any significant UCs.  Mother and FOB at bedside.  Objective: BP 110/63   Pulse 89   Temp 98.4 F (36.9 C) (Oral)   Resp 18   Ht 5\' 4"  (1.626 m)   Wt 85.3 kg (188 lb)   BMI 32.27 kg/m  No intake/output data recorded. No intake/output data recorded.  FHT: Category 1 UC:   regular, every 3-4 minutes SVE:   Dilation: 3 Effacement (%): 50 Station: -1 Exam by:: dr. Estanislado Pandyrivard at 1844 Pitocin at 4 mu/min Leaking clear fluid  Assessment:  IUP at 39 5/7 weeks PROM at term at noon GBS negative Allergic to PCN and sulfa   Plan: Continue pitocin augmentation. Pain med/epidural prn.  Gloria Henderson, Gloria Henderson CNM 02/25/2016, 8:24 PM

## 2016-02-25 NOTE — Anesthesia Pain Management Evaluation Note (Signed)
  CRNA Pain Management Visit Note  Patient: Gloria Henderson, 25 y.o., female  "Hello [facility] am a [facility]. We have an anesthesia team available at all times to provide care throughout the hospital, including epidural management and anesthesia for C-section. I don't know your plan for the delivery whether it a natural birth, water birth, IV sedation, nitrous supplementation, doula or epidural, but we want to meet your pain goals."   1.Was your pain managed to your expectations on prior hospitalizations?     2.What is your expectation for pain management during this hospitalization?      3.How can we help you reach that goal?   Record the patient's initial score and the patient's pain goal.   Pain: 0  Pain Goal: 6 [facility] wants you to be able to say your pain was always managed very well.  Laban EmperorMalinova,[name] 02/25/2016

## 2016-02-25 NOTE — MAU Note (Signed)
Pt felt a gush around 1200.  Pinkish fluid.  Has some mild abdominal and back pain.

## 2016-02-26 ENCOUNTER — Encounter (HOSPITAL_COMMUNITY): Payer: Self-pay

## 2016-02-26 LAB — RPR: RPR Ser Ql: NONREACTIVE

## 2016-02-26 MED ORDER — ONDANSETRON HCL 4 MG PO TABS: 4.0000 mg | ORAL_TABLET | ORAL | Status: DC | PRN

## 2016-02-26 MED ORDER — SENNOSIDES-DOCUSATE SODIUM 8.6-50 MG PO TABS
2.0000 | ORAL_TABLET | ORAL | Status: DC
  Administered 2016-02-27: 2 via ORAL
  Filled 2016-02-26: qty 2

## 2016-02-26 MED ORDER — ZOLPIDEM TARTRATE 5 MG PO TABS: 5.0000 mg | ORAL_TABLET | Freq: Every evening | ORAL | Status: DC | PRN

## 2016-02-26 MED ORDER — COCONUT OIL OIL: 1.0000 "application " | TOPICAL_OIL | Status: DC | PRN

## 2016-02-26 MED ORDER — IBUPROFEN 600 MG PO TABS
600.0000 mg | ORAL_TABLET | Freq: Four times a day (QID) | ORAL | Status: DC | PRN
Start: 2016-02-26 — End: 2016-02-26

## 2016-02-26 MED ORDER — TETANUS-DIPHTH-ACELL PERTUSSIS 5-2.5-18.5 LF-MCG/0.5 IM SUSP: 0.5000 mL | Freq: Once | INTRAMUSCULAR | Status: DC

## 2016-02-26 MED ORDER — ONDANSETRON HCL 4 MG/2ML IJ SOLN: 4.0000 mg | INTRAMUSCULAR | Status: DC | PRN

## 2016-02-26 MED ORDER — ACETAMINOPHEN 325 MG PO TABS: 650.0000 mg | ORAL_TABLET | ORAL | Status: DC | PRN

## 2016-02-26 MED ORDER — BENZOCAINE-MENTHOL 20-0.5 % EX AERO
1.0000 "application " | INHALATION_SPRAY | CUTANEOUS | Status: DC | PRN
  Administered 2016-02-26: 1 via TOPICAL
  Filled 2016-02-26: qty 56

## 2016-02-26 MED ORDER — WITCH HAZEL-GLYCERIN EX PADS: 1.0000 "application " | MEDICATED_PAD | CUTANEOUS | Status: DC | PRN

## 2016-02-26 MED ORDER — DIBUCAINE 1 % RE OINT: 1.0000 "application " | TOPICAL_OINTMENT | RECTAL | Status: DC | PRN

## 2016-02-26 MED ORDER — PRENATAL MULTIVITAMIN CH
1.0000 | ORAL_TABLET | Freq: Every day | ORAL | Status: DC
  Administered 2016-02-26 – 2016-02-27 (×2): 1 via ORAL
  Filled 2016-02-26 (×2): qty 1

## 2016-02-26 MED ORDER — IBUPROFEN 600 MG PO TABS
600.0000 mg | ORAL_TABLET | Freq: Four times a day (QID) | ORAL | Status: DC
  Administered 2016-02-26 – 2016-02-27 (×5): 600 mg via ORAL
  Filled 2016-02-26 (×5): qty 1

## 2016-02-26 MED ORDER — DIPHENHYDRAMINE HCL 25 MG PO CAPS: 25.0000 mg | ORAL_CAPSULE | Freq: Four times a day (QID) | ORAL | Status: DC | PRN

## 2016-02-26 MED ORDER — FERROUS SULFATE 325 (65 FE) MG PO TABS
325.0000 mg | ORAL_TABLET | Freq: Two times a day (BID) | ORAL | Status: DC
  Administered 2016-02-26 – 2016-02-27 (×2): 325 mg via ORAL
  Filled 2016-02-26 (×2): qty 1

## 2016-02-26 MED ORDER — MEASLES, MUMPS & RUBELLA VAC ~~LOC~~ INJ
0.5000 mL | INJECTION | Freq: Once | SUBCUTANEOUS | Status: DC
  Filled 2016-02-26: qty 0.5

## 2016-02-26 MED ORDER — SIMETHICONE 80 MG PO CHEW: 80.0000 mg | CHEWABLE_TABLET | ORAL | Status: DC | PRN

## 2016-02-26 NOTE — Progress Notes (Signed)
  Subjective: Much more uncomfortable--on NVR IncBirthing Ball, mother rubbing back.  Wants IV pain med.  Objective: BP 103/69   Pulse 67   Temp 97.7 F (36.5 C) (Oral)   Resp 18   Ht 5\' 4"  (1.626 m)   Wt 85.3 kg (188 lb)   BMI 32.27 kg/m  No intake/output data recorded. No intake/output data recorded.  FHT: Category 1 UC:   regular, every 3 minutes SVE:   Dilation: 5 Effacement (%): 80 Station: -1 Exam by:: Daizha Anand CNM Pitocin at 14 mu/min  Assessment:  PROM at term x 16 1/2 hours Early labor GBS negative  Plan: Continue pit augmentation IV pain med/epidural prn.  Nigel BridgemanLATHAM, Delno Blaisdell CNM 02/26/2016, 2:34 AM

## 2016-02-26 NOTE — Lactation Note (Signed)
This note was copied from a baby's chart. Lactation Consultation Note  Patient Name: Gloria Henderson ZOXWR'UToday's Date: 02/26/2016 Reason for consult: Initial assessment;Difficult latch  Visited with Mom and FOB, baby 9 hrs old.  Baby hasn't been able to latch.  Baby full term, 4943w6d, 7#10.6 and has facial bruising.  2nd stage of labor very quick.  Mom has large breasts, with mostly flat nipples.  Tried with manual pump to evert nipple, areola is compressible.  Baby not opening wide enough to latch onto areola.  After manually pumping, initiated a 24 mm, then 20 mm nipple shield.  Nipple still doesn't extend into shield well.  Front loaded shield with some formula, and baby took a couple sucks.  Teaching on how to support baby's head, and support and compress breast. Set up DEBP at bedside, but room filled with family.  Mom to ring for her nurse to help her initiate pumping to support her milk supply.  Shells brought in room.  Asked Mom to get help once she gets her bra on.  Mom exhausted right now, and not sure how much of this information is sinking in.   Brochure left in room.  Explained about IP and OP lactation services.  Encouraged Mom to keep baby skin to skin, and feed him often on cue.  To ask her RN for assistance as needed with latching and pumping.  LC to follow up in am.  Maternal Data Formula Feeding for Exclusion: No Has patient been taught Hand Expression?: Yes Does the patient have breastfeeding experience prior to this delivery?: No  Feeding Feeding Type: Breast Fed Length of feed: 5 min  LATCH Score/Interventions Latch: Repeated attempts needed to sustain latch, nipple held in mouth throughout feeding, stimulation needed to elicit sucking reflex. Intervention(s): Skin to skin;Teach feeding cues;Waking techniques Intervention(s): Adjust position;Assist with latch;Breast massage;Breast compression  Audible Swallowing: A few with stimulation (with supplement in nipple  shield) Intervention(s): Skin to skin;Hand expression  Type of Nipple: Flat Intervention(s): Shells;Hand pump;Double electric pump  Comfort (Breast/Nipple): Soft / non-tender     Hold (Positioning): Assistance needed to correctly position infant at breast and maintain latch. Intervention(s): Support Pillows;Breastfeeding basics reviewed;Position options;Skin to skin  LATCH Score: 6  Lactation Tools Discussed/Used Tools: Nipple Shields Nipple shield size: 20 WIC Program: No Pump Review: Setup, frequency, and cleaning;Milk Storage Initiated by:: [name] RN IBCLC Date initiated:: 02/26/16   Consult Status Consult Status: Follow-up Date: 02/27/16 Follow-up type: In-patient    Judee ClaraSmith, Oumou Smead E 02/26/2016, 2:43 PM

## 2016-02-26 NOTE — Discharge Instructions (Signed)

## 2016-02-26 NOTE — Discharge Summary (Signed)
[facility]   Patient Name:   Gloria Henderson DOB:     07/08/1990 MRN:     [id number]  Date of Admission:   02/25/2016 Date of Discharge:  02/27/2016  Admitting diagnosis:    39.5w water leaking Principal Problem:   Vaginal delivery Active Problems:   Full-term premature rupture of membranes      Discharge diagnosis:    39.5w water leaking Principal Problem:   Vaginal delivery Active Problems:   Full-term premature rupture of membranes                                                                 Post partum procedures: NA  Type of Delivery:  SVB  Delivering Provider: Nigel BridgemanLATHAM, Cresta Riden   Date of Delivery:  02/25/16  Newborn Data:    Live born female  Birth Weight: 7 lb 10.9 oz (3484 g) APGAR: 9, 9  Baby's Name:   Allyne GeeJaiden Baby Feeding:   Breast Disposition:   home with mother  Complications:   None  Hospital course:      Onset of Labor With Vaginal Delivery     25 y.o. yo G1P1001 at 5947w6d was admitted in Latent Labor with PROM at term on 02/25/2016. Patient had an uncomplicated labor course as follows:  Membrane Rupture Time/Date: 2:00 PM ,02/25/2016   Intrapartum Procedures: Episiotomy: None [1]                                         Lacerations:  2nd degree [3];Perineal [11]  Patient had a delivery of a Viable infant. 02/26/2016  Information for the patient's newborn:  Janene HarveyBeasley, Boy Destine [[id number]][[id number]]  Delivery Method: Vaginal, Spontaneous Delivery (Filed from Delivery Summary)    Pateint had an uncomplicated postpartum course.  She is ambulating, tolerating a regular diet, passing flatus, and urinating well. Patient is discharged home in stable condition on 02/27/16.    Physical Exam:   Vitals:   02/26/16 0825 02/26/16 1200 02/26/16 1933 02/27/16 0620  BP: (!) 109/57 110/67 110/68 (!) 103/47  Pulse: 73 74 65 74  Resp: 18 18 18 17   Temp: 98.4 F (36.9 C) 97.9 F (36.6 C) 98.1 F (36.7 C) 98.2 F (36.8 C)  TempSrc: Oral  Oral Oral Oral  Weight:      Height:       General: alert Lochia: appropriate Uterine Fundus: firm Incision: Healing well with no significant drainage DVT Evaluation: No evidence of DVT seen on physical exam. Negative Homan's sign.  Labs:  CBC Latest Ref Rng & Units 02/27/2016 02/25/2016 01/09/2014  WBC 4.0 - 10.5 K/uL 11.0(H) 8.7 7.5  Hemoglobin 12.0 - 15.0 g/dL 10.3(L) 11.3(L) 12.0  Hematocrit 36.0 - 46.0 % 30.8(L) 33.4(L) 36.3  Platelets 150 - 400 K/uL 173 152 203     Discharge instruction: per After Visit Summary and "Baby and Me Booklet".  After Visit Meds:    Medication List    TAKE these medications   ibuprofen 600 MG tablet Commonly known as:  ADVIL,MOTRIN Take 1 tablet (600 mg total) by mouth every 6 (six) hours as needed.   prenatal multivitamin Tabs tablet Take 1  tablet by mouth at bedtime.       Diet: routine diet  Activity: Advance as tolerated. Pelvic rest for 6 weeks.   Outpatient follow up:6 weeks Follow up Appt:No future appointments. Follow up visit: No Follow-up on file.  Postpartum contraception: Undecided  02/27/2016 Nigel Bridgeman, CNM

## 2016-02-27 LAB — CBC
HCT: 30.8 % — ABNORMAL LOW (ref 36.0–46.0)
Hemoglobin: 10.3 g/dL — ABNORMAL LOW (ref 12.0–15.0)
MCH: 29.6 pg (ref 26.0–34.0)
MCHC: 33.4 g/dL (ref 30.0–36.0)
MCV: 88.5 fL (ref 78.0–100.0)
PLATELETS: 173 10*3/uL (ref 150–400)
RBC: 3.48 MIL/uL — ABNORMAL LOW (ref 3.87–5.11)
RDW: 13.4 % (ref 11.5–15.5)
WBC: 11 10*3/uL — ABNORMAL HIGH (ref 4.0–10.5)

## 2016-02-27 MED ORDER — IBUPROFEN 600 MG PO TABS: 600.0000 mg | ORAL_TABLET | Freq: Four times a day (QID) | ORAL | 2 refills | Status: AC | PRN

## 2016-02-29 NOTE — Progress Notes (Signed)
Post discharge chart review completed.  

## 2020-07-12 ENCOUNTER — Other Ambulatory Visit: Payer: Self-pay

## 2020-07-12 ENCOUNTER — Inpatient Hospital Stay (HOSPITAL_COMMUNITY)
Admission: AD | Admit: 2020-07-12 | Discharge: 2020-07-12 | Disposition: A | Discharge status: Home / Self Care | Payer: BC Managed Care – PPO | Attending: Obstetrics and Gynecology | Admitting: Obstetrics and Gynecology

## 2020-07-12 DIAGNOSIS — Z87891 Personal history of nicotine dependence: Secondary | ICD-10-CM | POA: Insufficient documentation

## 2020-07-12 DIAGNOSIS — Z3200 Encounter for pregnancy test, result unknown: Secondary | ICD-10-CM | POA: Insufficient documentation

## 2020-07-12 DIAGNOSIS — Z3202 Encounter for pregnancy test, result negative: Secondary | ICD-10-CM

## 2020-07-12 LAB — POCT PREGNANCY, URINE: Preg Test, Ur: NEGATIVE

## 2020-07-12 NOTE — MAU Note (Signed)
Pt reports she is here for pregnancy blood work. States she is trying to get pregnant. Her LMP was 01/15 but it was abnormal. Two at home preg tests were negative.

## 2020-07-12 NOTE — MAU Provider Note (Signed)
Chief Complaint: Possible Pregnancy   None     SUBJECTIVE HPI: Gloria Henderson is a 30 y.o. G1P1001 who presents to maternity admissions for a pregnancy confirmation. She reports and abnormal period on 07/03/20 and 2 negative pregnancy tests at home. She wants to make sure she is or is not pregnant. No other concerns.     Past Medical History:  Diagnosis Date  . Anemia   . History of MRSA infection    Past Surgical History:  Procedure Laterality Date  . NO PAST SURGERIES     Social History   Socioeconomic History  . Marital status: Single    Spouse name: Not on file  . Number of children: Not on file  . Years of education: Not on file  . Highest education level: Not on file  Occupational History  . Not on file  Tobacco Use  . Smoking status: Former Games developer  . Smokeless tobacco: Former Engineer, water and Sexual Activity  . Alcohol use: No  . Drug use: No  . Sexual activity: Yes  Other Topics Concern  . Not on file  Social History Narrative  . Not on file   Social Determinants of Health   Financial Resource Strain: Not on file  Food Insecurity: Not on file  Transportation Needs: Not on file  Physical Activity: Not on file  Stress: Not on file  Social Connections: Not on file  Intimate Partner Violence: Not on file   No current facility-administered medications on file prior to encounter.   Current Outpatient Medications on File Prior to Encounter  Medication Sig Dispense Refill  . ibuprofen (ADVIL,MOTRIN) 600 MG tablet Take 1 tablet (600 mg total) by mouth every 6 (six) hours as needed. 30 tablet 2  . Prenatal Vit-Fe Fumarate-FA (PRENATAL MULTIVITAMIN) TABS tablet Take 1 tablet by mouth at bedtime.      Allergies  Allergen Reactions  . Sulfa Antibiotics Anaphylaxis  . Penicillins Rash and Other (See Comments)    Has patient had a PCN reaction causing immediate rash, facial/tongue/throat swelling, SOB or lightheadedness with hypotension: No Has patient  had a PCN reaction causing severe rash involving mucus membranes or skin necrosis: No Has patient had a PCN reaction that required hospitalization No Has patient had a PCN reaction occurring within the last 10 years: No If all of the above answers are "NO", then may proceed with Cephalosporin use.     ROS:  Review of Systems  Gastrointestinal: Negative for abdominal pain.  Genitourinary: Negative for vaginal bleeding.    I have reviewed patient's Past Medical Hx, Surgical Hx, Family Hx, Social Hx, medications and allergies.   Physical Exam   Patient Vitals for the past 24 hrs:  BP Temp Temp src Pulse Resp SpO2 Height Weight  07/12/20 1752 111/63 98.3 F (36.8 C) Oral 64 16 100 % 5\' 5"  (1.651 m) 85.7 kg   Physical Exam Nursing note reviewed.  Constitutional:      Appearance: Normal appearance.  Musculoskeletal:        General: Normal range of motion.  Neurological:     Mental Status: She is alert and oriented to person, place, and time.  Psychiatric:        Behavior: Behavior normal.    MDM Patient denies any concerning symptoms in need of emergent evaluation. She was offered to go over to the ED which was not suggested given her non emergent complaint.  She was encouraged to follow up with [facility].  ASSESSMENT MSE  Complete Encounter for pregnancy test   PLAN Discharge patient at her request to seek non-emergent medical care elsewhere   Jermaine Neuharth, Harolyn Rutherford, NP 07/12/2020 6:19 PM

## 2022-02-03 ENCOUNTER — Other Ambulatory Visit: Payer: Self-pay | Admitting: Obstetrics and Gynecology

## 2022-02-03 DIAGNOSIS — Z3169 Encounter for other general counseling and advice on procreation: Secondary | ICD-10-CM

## 2022-08-01 ENCOUNTER — Other Ambulatory Visit: Payer: Self-pay | Admitting: Obstetrics and Gynecology

## 2022-08-01 DIAGNOSIS — N926 Irregular menstruation, unspecified: Secondary | ICD-10-CM
